# Patient Record
Sex: Male | Born: 1965 | Race: Black or African American | Hispanic: No | Smoking: Current some day smoker
Health system: Southern US, Community
[De-identification: ages and names within clinical notes are randomized; demographics above are authoritative.]

## PROBLEM LIST (undated history)

## (undated) HISTORY — PX: TESTICLE SURGERY: SHX794

---

## 2000-12-18 ENCOUNTER — Encounter: Admission: RE | Admit: 2000-12-18 | Discharge: 2001-01-23 | Payer: Self-pay | Admitting: *Deleted

## 2003-03-04 ENCOUNTER — Emergency Department (HOSPITAL_COMMUNITY): Admission: EM | Admit: 2003-03-04 | Discharge: 2003-03-04 | Payer: Self-pay | Admitting: Emergency Medicine

## 2011-09-15 ENCOUNTER — Emergency Department (HOSPITAL_COMMUNITY)
Admission: EM | Admit: 2011-09-15 | Discharge: 2011-09-15 | Disposition: A | Discharge status: Home / Self Care | Payer: Self-pay | Attending: Emergency Medicine | Admitting: Emergency Medicine

## 2011-09-15 DIAGNOSIS — F1410 Cocaine abuse, uncomplicated: Secondary | ICD-10-CM | POA: Insufficient documentation

## 2011-09-15 DIAGNOSIS — F17200 Nicotine dependence, unspecified, uncomplicated: Secondary | ICD-10-CM | POA: Insufficient documentation

## 2011-09-15 DIAGNOSIS — F1910 Other psychoactive substance abuse, uncomplicated: Secondary | ICD-10-CM

## 2011-09-15 LAB — BASIC METABOLIC PANEL
CO2: 28 mEq/L (ref 19–32)
Glucose, Bld: 122 mg/dL — ABNORMAL HIGH (ref 70–99)
Potassium: 3.5 mEq/L (ref 3.5–5.1)
Sodium: 139 mEq/L (ref 135–145)

## 2011-09-15 LAB — CBC
Platelets: 268 10*3/uL (ref 150–400)
RBC: 4.34 MIL/uL (ref 4.22–5.81)
WBC: 6.7 10*3/uL (ref 4.0–10.5)

## 2011-09-15 LAB — DIFFERENTIAL
Lymphocytes Relative: 33 % (ref 12–46)
Lymphs Abs: 2.2 10*3/uL (ref 0.7–4.0)
Neutro Abs: 3.9 10*3/uL (ref 1.7–7.7)
Neutrophils Relative %: 58 % (ref 43–77)

## 2011-09-15 LAB — ETHANOL: Alcohol, Ethyl (B): <11 mg/dL (ref 0–11)

## 2011-09-15 MED ORDER — ALPRAZOLAM 0.5 MG PO TABS: ORAL_TABLET | ORAL | Status: DC

## 2011-09-15 NOTE — BH Assessment (Signed)
Assessment Note   Allen Cruz is an 46 y.o. male. The patient comes to the ED, seeking a return to rehab for his cocaine addiction. He was at [facility] for 14 days in December 2012. After discharge he did not follow up with [facility] as scheduled. He says he did not have transportation. After about a week he relapsed , and is again binging on crack cocaine.  He says he will use 1 or 2 days then stop for 3 or 4 days. He blames  The environment he is living in for his relapse. He is with family and they all do drugs or alcohol according to him. He denies any SI or HI. He is not psychotic.  He is unsure how long he has been using cocaine. He uses it by binging for a day or two, the he will not use for several days. His last use was 1213.  Axis I:  Cocaine Dependence Axis II: Deferred Axis III: History reviewed. No pertinent past medical history. Axis IV: economic problems, housing problems, occupational problems, problems related to social environment and problems with access to health care services Axis V: 41-50 serious symptoms  Past Medical History: History reviewed. No pertinent past medical history.  Past Surgical History  Procedure Date  . Testicle surgery     Family History: No family history on file.  Social History:  reports that he has been smoking.  He does not have any smokeless tobacco history on file. He reports that he uses illicit drugs (Cocaine) about 3 times per week. He reports that he does not drink alcohol.  Additional Social History:    Allergies:  Allergies  Allergen Reactions  . Ranitidine Hives    Home Medications:  No current facility-administered medications on file as of 09/15/2011.   No current outpatient prescriptions on file as of 09/15/2011.    OB/GYN Status:  No LMP for male patient.  General Assessment Data Location of Assessment: [facility] Assessment: Yes Living Arrangements: Spouse/significant other;Family members;Non-Relatives Can pt  return to current living arrangement?: Yes Admission Status: Voluntary Is patient capable of signing voluntary admission?: Yes Transfer from: Acute Hospital Referral Source: MD  Education Status Is patient currently in school?: No  Risk to self Suicidal Ideation: No Suicidal Intent: No Is patient at risk for suicide?: No Suicidal Plan?: No Access to Means: No What has been your use of drugs/alcohol within the last 12 months?: crack cocaine Previous Attempts/Gestures: No How many times?: 0  Other Self Harm Risks: none Triggers for Past Attempts: None known Intentional Self Injurious Behavior: None Family Suicide History: No Recent stressful life event(s): Financial Problems;Recent negative physical changes Persecutory voices/beliefs?: No Depression: Yes Depression Symptoms: Isolating;Loss of interest in usual pleasures Substance abuse history and/or treatment for substance abuse?: Yes Suicide prevention information given to non-admitted patients: Yes  Risk to Others Homicidal Ideation: No Thoughts of Harm to Others: No Current Homicidal Intent: No Current Homicidal Plan: No Access to Homicidal Means: No Identified Victim:  none History of harm to others?: No Assessment of Violence: None Noted Violent Behavior Description: calm Does patient have access to weapons?: No Criminal Charges Pending?: No Does patient have a court date: No  Psychosis Hallucinations: None noted Delusions: None noted  Mental Status Report Appear/Hygiene: Body odor Eye Contact: Fair Motor Activity: Restlessness Speech: Logical/coherent Level of Consciousness: Alert Mood: Anxious Affect: Blunted Anxiety Level: Moderate Thought Processes: Coherent;Relevant Judgement: Unimpaired Orientation: Person;Place;Time;Situation Obsessive Compulsive Thoughts/Behaviors: None  Cognitive Functioning  Concentration: Normal Memory: Recent Intact;Remote Intact IQ: Average Insight: Poor Impulse  Control: Poor Appetite: Good Weight Loss: 0  Weight Gain: 0  Sleep: No Change Total Hours of Sleep: 6  Vegetative Symptoms: None  Prior Inpatient Therapy Prior Inpatient Therapy: Yes Prior Therapy Dates: 12/12 Prior Therapy Facilty/Provider(s): [facility] Reason for Treatment: rehab  Prior Outpatient Therapy Prior Outpatient Therapy: Yes Prior Therapy Dates: 2012 Prior Therapy Facilty/Provider(s): [facility] Reason for Treatment: cocaine abuse            Values / Beliefs Cultural Requests During Hospitalization: None Spiritual Requests During Hospitalization: None        Additional Information 1:1 In Past 12 Months?: No CIRT Risk: No Elopement Risk: No Does patient have medical clearance?: Yes     Disposition: Spoke with Allen Cruz at [facility], she informed us that [facility] nor [facility] would authorize his stay in rehab. They will see him 1313 at 8:00 am to discuss long term treatment  Options with him.  They also said that [facility] would not readmit him for up to a year as is their policy. Patient was informed that rehab could not be arranged through the ED and that he would have to go to [facility] Friday or Monday to be seen for follow up and referral. Dr. Estell Harpin is in agreement with this disposition. Disposition Disposition of Patient: Outpatient treatment;Referred to Type of outpatient treatment: Adult  On Site Evaluation by:   Reviewed with Physician:     Jearld Pies 09/15/2011 3:00 PM

## 2011-09-15 NOTE — ED Provider Notes (Signed)
History   This chart was scribed for Benny Lennert, MD by Clarita Crane. The patient was seen in room [facility] and the patient's care was started at 12:35PM.   CSN: [id number]  Arrival date & time 09/15/11  1010   First MD Initiated Contact with Patient 09/15/11 1232      Chief Complaint  Patient presents with  . Medical Clearance    (Consider location/radiation/quality/duration/timing/severity/associated sxs/prior treatment) HPI Allen Cruz is a 46 y.o. male who presents to the Emergency Department requesting rehab for cocaine abuse. Patient notes he has been abusing cocaine on a daily basis with his last use 9 hours ago. Patient states he was previously treated at [facility] for cocaine abuse foor 14 days and was most recently discharged from [facility] on Aug 16, 2011. Denies SI/HI, hallucinations.   History reviewed. No pertinent past medical history.  Past Surgical History  Procedure Date  . Testicle surgery     No family history on file.  History  Substance Use Topics  . Smoking status: Current Some Day Smoker  . Smokeless tobacco: Not on file  . Alcohol Use: No      Review of Systems 10 Systems reviewed and are negative for acute change except as noted in the HPI.  Allergies  Ranitidine  Home Medications   Current Outpatient Rx  Name Route Sig Dispense Refill  . ASPIRIN 325 MG PO TABS Oral Take 325 mg by mouth every 4 (four) hours as needed. pain     . IBUPROFEN 200 MG PO TABS Oral Take 200 mg by mouth every 6 (six) hours as needed. Pain      . ALPRAZOLAM 0.5 MG PO TABS  1 every 8 hours for anxiety or sleep 20 tablet 0    BP 121/81  Pulse 80  Temp(Src) 98 F (36.7 C) (Oral)  Resp 18  Ht 5\' 6"  (1.676 m)  Wt 215 lb (97.523 kg)  BMI 34.70 kg/m2  SpO2 100%  Physical Exam  Nursing note and vitals reviewed. Constitutional: He is oriented to person, place, and time. He appears well-developed and well-nourished. No distress.  HENT:  Head: Normocephalic  and atraumatic.  Eyes: EOM are normal. Pupils are equal, round, and reactive to light.  Neck: Neck supple. No tracheal deviation present.  Cardiovascular: Normal rate.   Pulmonary/Chest: Effort normal. No respiratory distress.  Abdominal: Soft. He exhibits no distension.  Musculoskeletal: Normal range of motion. He exhibits no edema.  Neurological: He is alert and oriented to person, place, and time. No sensory deficit.  Skin: Skin is warm and dry.  Psychiatric: He has a normal mood and affect. His speech is normal and behavior is normal. Thought content normal. He expresses no homicidal and no suicidal ideation. He expresses no suicidal plans and no homicidal plans.    ED Course  Procedures (including critical care time)  DIAGNOSTIC STUDIES: Oxygen Saturation is 100% on room air, normal by my interpretation.    COORDINATION OF CARE: 12:48PM- ACT team counselor informed of patient history and will speak with patient.  3:18PM- Patient informed of intent to d/c home. Requests medication to help him rest.   Labs Reviewed  BASIC METABOLIC PANEL - Abnormal; Notable for the following:    Glucose, Bld 122 (*)    Creatinine, Ser 1.48 (*)    GFR calc non Af Amer 56 (*)    GFR calc Af Amer 64 (*)    All other components within normal limits  CBC  DIFFERENTIAL  ETHANOL  URINALYSIS, ROUTINE W REFLEX MICROSCOPIC  URINE RAPID DRUG SCREEN (HOSP PERFORMED)   No results found.   1. Substance abuse    Pt seen by act and he was [facility]   MDM       The chart was scribed for me under my direct supervision.  I personally performed the history, physical, and medical decision making and all procedures in the evaluation of this patient.Benny Lennert, MD 09/15/11 [id number]

## 2011-09-15 NOTE — ED Notes (Signed)
Pt eval by ACT. Waiting for disposition

## 2011-09-15 NOTE — ED Notes (Signed)
Pt here and reports using cocaine on daily basis "as much as I can do in a day" last take was at 0400 this am. Denies si/hi. Pt here to receive detox. Pt recently was seen and treated but kept only 14days and feels like he needs longer treatment.

## 2011-09-19 ENCOUNTER — Encounter (HOSPITAL_COMMUNITY): Payer: Self-pay | Admitting: Emergency Medicine

## 2011-09-19 ENCOUNTER — Emergency Department (HOSPITAL_COMMUNITY): Payer: Self-pay

## 2011-09-19 ENCOUNTER — Emergency Department (HOSPITAL_COMMUNITY)
Admission: EM | Admit: 2011-09-19 | Discharge: 2011-09-20 | Disposition: A | Discharge status: Home / Self Care | Payer: Self-pay | Attending: Emergency Medicine | Admitting: Emergency Medicine

## 2011-09-19 DIAGNOSIS — F411 Generalized anxiety disorder: Secondary | ICD-10-CM | POA: Insufficient documentation

## 2011-09-19 DIAGNOSIS — F17200 Nicotine dependence, unspecified, uncomplicated: Secondary | ICD-10-CM | POA: Insufficient documentation

## 2011-09-19 DIAGNOSIS — Z79899 Other long term (current) drug therapy: Secondary | ICD-10-CM | POA: Insufficient documentation

## 2011-09-19 DIAGNOSIS — M79609 Pain in unspecified limb: Secondary | ICD-10-CM | POA: Insufficient documentation

## 2011-09-19 DIAGNOSIS — Z7982 Long term (current) use of aspirin: Secondary | ICD-10-CM | POA: Insufficient documentation

## 2011-09-19 DIAGNOSIS — IMO0002 Reserved for concepts with insufficient information to code with codable children: Secondary | ICD-10-CM | POA: Insufficient documentation

## 2011-09-19 DIAGNOSIS — R450 Nervousness: Secondary | ICD-10-CM | POA: Insufficient documentation

## 2011-09-19 DIAGNOSIS — F1910 Other psychoactive substance abuse, uncomplicated: Secondary | ICD-10-CM | POA: Insufficient documentation

## 2011-09-19 DIAGNOSIS — X58XXXA Exposure to other specified factors, initial encounter: Secondary | ICD-10-CM | POA: Insufficient documentation

## 2011-09-19 LAB — CBC
HCT: 39.1 % (ref 39.0–52.0)
Hemoglobin: 12.6 g/dL — ABNORMAL LOW (ref 13.0–17.0)
MCH: 30.2 pg (ref 26.0–34.0)
MCHC: 32.2 g/dL (ref 30.0–36.0)
MCV: 93.8 fL (ref 78.0–100.0)
Platelets: 249 10*3/uL (ref 150–400)
RBC: 4.17 MIL/uL — ABNORMAL LOW (ref 4.22–5.81)
RDW: 13.5 % (ref 11.5–15.5)
WBC: 6.6 10*3/uL (ref 4.0–10.5)

## 2011-09-19 LAB — RAPID URINE DRUG SCREEN, HOSP PERFORMED
Amphetamines: NOT DETECTED
Barbiturates: NOT DETECTED
Benzodiazepines: NOT DETECTED
Cocaine: POSITIVE — AB
Opiates: NOT DETECTED
Tetrahydrocannabinol: NOT DETECTED

## 2011-09-19 LAB — COMPREHENSIVE METABOLIC PANEL
Albumin: 3.5 g/dL (ref 3.5–5.2)
BUN: 18 mg/dL (ref 6–23)
CO2: 27 mEq/L (ref 19–32)
Chloride: 107 mEq/L (ref 96–112)
Creatinine, Ser: 1.22 mg/dL (ref 0.50–1.35)
GFR calc Af Amer: 81 mL/min — ABNORMAL LOW (ref >90)
Glucose, Bld: 101 mg/dL — ABNORMAL HIGH (ref 70–99)
Potassium: 4 mEq/L (ref 3.5–5.1)
Total Bilirubin: 0.2 mg/dL — ABNORMAL LOW (ref 0.3–1.2)
Total Protein: 7.3 g/dL (ref 6.0–8.3)

## 2011-09-19 LAB — COMPREHENSIVE METABOLIC PANEL WITH GFR
ALT: 14 U/L (ref 0–53)
AST: 17 U/L (ref 0–37)
Alkaline Phosphatase: 88 U/L (ref 39–117)
Calcium: 9 mg/dL (ref 8.4–10.5)
GFR calc non Af Amer: 70 mL/min — ABNORMAL LOW (ref >90)
Sodium: 141 meq/L (ref 135–145)

## 2011-09-19 LAB — ETHANOL: Alcohol, Ethyl (B): <11 mg/dL (ref 0–11)

## 2011-09-19 MED ORDER — ADULT MULTIVITAMIN W/MINERALS CH
1.0000 | ORAL_TABLET | Freq: Every day | ORAL | Status: DC
  Administered 2011-09-19 – 2011-09-20 (×2): 1 via ORAL
  Filled 2011-09-19 (×2): qty 1

## 2011-09-19 MED ORDER — IBUPROFEN 200 MG PO TABS
600.0000 mg | ORAL_TABLET | Freq: Three times a day (TID) | ORAL | Status: DC | PRN
  Administered 2011-09-20 (×2): 600 mg via ORAL
  Filled 2011-09-19 (×2): qty 3

## 2011-09-19 MED ORDER — ZOLPIDEM TARTRATE 5 MG PO TABS
5.0000 mg | ORAL_TABLET | Freq: Every evening | ORAL | Status: DC | PRN
  Administered 2011-09-20: 5 mg via ORAL
  Filled 2011-09-19: qty 1

## 2011-09-19 MED ORDER — LORAZEPAM 1 MG PO TABS: 1.0000 mg | ORAL_TABLET | Freq: Three times a day (TID) | ORAL | Status: DC | PRN

## 2011-09-19 MED ORDER — FOLIC ACID 1 MG PO TABS
1.0000 mg | ORAL_TABLET | Freq: Every day | ORAL | Status: DC
  Administered 2011-09-19 – 2011-09-20 (×2): 1 mg via ORAL
  Filled 2011-09-19 (×3): qty 1

## 2011-09-19 MED ORDER — LORAZEPAM 2 MG/ML IJ SOLN: 1.0000 mg | Freq: Four times a day (QID) | INTRAMUSCULAR | Status: DC | PRN

## 2011-09-19 MED ORDER — ACETAMINOPHEN 325 MG PO TABS
650.0000 mg | ORAL_TABLET | ORAL | Status: DC | PRN
  Administered 2011-09-19: 650 mg via ORAL
  Filled 2011-09-19: qty 2

## 2011-09-19 MED ORDER — ALUM & MAG HYDROXIDE-SIMETH 200-200-20 MG/5ML PO SUSP: 30.0000 mL | ORAL | Status: DC | PRN

## 2011-09-19 MED ORDER — ONDANSETRON HCL 8 MG PO TABS: 4.0000 mg | ORAL_TABLET | Freq: Three times a day (TID) | ORAL | Status: DC | PRN

## 2011-09-19 MED ORDER — LORAZEPAM 1 MG PO TABS
1.0000 mg | ORAL_TABLET | Freq: Four times a day (QID) | ORAL | Status: DC | PRN
  Filled 2011-09-19: qty 1

## 2011-09-19 MED ORDER — THIAMINE HCL 100 MG/ML IJ SOLN: 100.0000 mg | Freq: Every day | INTRAMUSCULAR | Status: DC

## 2011-09-19 MED ORDER — NICOTINE 21 MG/24HR TD PT24
21.0000 mg | MEDICATED_PATCH | Freq: Every day | TRANSDERMAL | Status: DC
  Administered 2011-09-19: 21 mg via TRANSDERMAL
  Filled 2011-09-19: qty 1

## 2011-09-19 MED ORDER — VITAMIN B-1 100 MG PO TABS
100.0000 mg | ORAL_TABLET | Freq: Every day | ORAL | Status: DC
  Administered 2011-09-19 – 2011-09-20 (×3): 100 mg via ORAL
  Filled 2011-09-19 (×2): qty 1

## 2011-09-19 NOTE — ED Notes (Signed)
Pt denies si/hi. Pt belongings returned.

## 2011-09-19 NOTE — BH Assessment (Addendum)
Assessment Note   Allen Cruz is an 46 y.o. male.  Allen Cruz came to [facility] seeking detox service from ETOH.  He reports drinking three 24oz beers daily for the last two weeks.  Last consumption was yesterday evening and was two beers.  Allen Cruz is also using crack but that use varies although the frequency has been increasing over the last month.  Allen Cruz said that he has no SI, HI or A/V hallucinations at this time.  He is depressed about his family life and drug abuse.  Allen Cruz is currently living with a daughter in [place] and is currently unemployed.  He is interested in going to a halfway house if possible after treatment.  Allen Cruz is interested in returning to [facility].  He left there on December 4 and remained dry until December 24 when he resumed drinking a 6 pack daily.  Allen Cruz was also referred to [facility].  Allen Cruz at [facility] said that Allen Cruz had been accepted.  [facility] had been contacted also.  This was explained to Allen Cruz and he said that he could get his wife to drive him when she gets off work at [employer].  He understood that he is to go straight there without any stops.  He was also given printed directions to the facility. Axis I: Alcohol Abuse Axis II: Deferred Axis III: History reviewed. No pertinent past medical history. Axis IV: occupational problems, other psychosocial or environmental problems and problems with primary support group Axis V: 31-40 impairment in reality testing  Past Medical History: History reviewed. No pertinent past medical history.  Past Surgical History  Procedure Date  . Testicle surgery     Family History: History reviewed. No pertinent family history.  Social History:  reports that he has been smoking.  He does not have any smokeless tobacco history on file. He reports that he drinks alcohol. He reports that he uses illicit drugs (Cocaine) about 3 times per week.  Additional Social History:  Alcohol / Drug Use Pain Medications:  Denies Prescriptions: None Over the Counter: N/A History of alcohol / drug use?: Yes Substance #1 Name of Substance 1: ETOH, beer 1 - Age of First Use: 46 years old 1 - Amount (size/oz): Drinking three 24 oz beers (equal to a 6-pack) daily 1 - Frequency: Daily  1 - Duration: Last 2 weeks 1 - Last Use / Amount: 01/06, drank 2 beers Substance #2 Name of Substance 2: Crack cocaine 2 - Age of First Use: 46 years old 2 - Amount (size/oz): Varies 2 - Frequency: 4-6 times weekly 2 - Duration: Last two weeks 2 - Last Use / Amount: 01/06  Used an "8-Ball" Allergies:  Allergies  Allergen Reactions  . Ranitidine Hives    Home Medications:  Medications Prior to Admission  Medication Dose Route Frequency Provider Last Rate Last Dose  . acetaminophen (TYLENOL) tablet 650 mg  650 mg Oral Q4H PRN [name], PA   650 mg at 09/19/11 1902  . alum & mag hydroxide-simeth (MAALOX/MYLANTA) 200-200-20 MG/5ML suspension 30 mL  30 mL Oral PRN [name], PA      . folic acid (FOLVITE) tablet 1 mg  1 mg Oral Daily [name], [place]   1 mg at 09/19/11 2012  . ibuprofen (ADVIL,MOTRIN) tablet 600 mg  600 mg Oral Q8H PRN [name], [place]      . LORazepam (ATIVAN) tablet 1 mg  1 mg Oral Q6H PRN [name], PA      .  mulitivitamin with minerals tablet 1 tablet  1 tablet Oral Daily [name], [place]   1 tablet at 09/19/11 2007  . nicotine (NICODERM CQ - dosed in mg/24 hours) patch 21 mg  21 mg Transdermal Daily [name], [place]   21 mg at 09/19/11 1853  . ondansetron (ZOFRAN) tablet 4 mg  4 mg Oral Q8H PRN [name], [place]      . thiamine (VITAMIN B-1) tablet 100 mg  100 mg Oral Daily [name], [place]   100 mg at 09/19/11 2010  . zolpidem (AMBIEN) tablet 5 mg  5 mg Oral QHS PRN [name], PA      . DISCONTD: LORazepam (ATIVAN) injection 1 mg  1 mg Intravenous Q6H PRN [name], PA       . DISCONTD: LORazepam (ATIVAN) tablet 1 mg  1 mg Oral Q8H PRN [name], PA      . DISCONTD: thiamine (B-1) injection 100 mg  100 mg Intravenous Daily [name], [place]       Medications Prior to Admission  Medication Sig Dispense Refill  . ALPRAZolam (XANAX) 0.5 MG tablet Take 0.5 mg by mouth 3 (three) times daily as needed. for anxiety or sleep       . aspirin 325 MG tablet Take 325 mg by mouth daily.       Marland Kitchen ibuprofen (ADVIL,MOTRIN) 200 MG tablet Take 200 mg by mouth every 6 (six) hours as needed. Pain         OB/GYN Status:  No LMP for male patient.  General Assessment Data Location of Assessment: [facility] ED Living Arrangements: Family members Can pt return to current living arrangement?: Yes Admission Status: Voluntary Is patient capable of signing voluntary admission?: Yes Transfer from: Acute Hospital Referral Source: Self/Family/Friend     Risk to self Suicidal Ideation: No Suicidal Intent: No Is patient at risk for suicide?: No Suicidal Plan?: No Access to Means: No What has been your use of drugs/alcohol within the last 12 months?:  (Crack cocaine use and ETOH use) Previous Attempts/Gestures: Yes How many times?:  (2-3 times) Other Self Harm Risks: None Triggers for Past Attempts: Family contact Intentional Self Injurious Behavior: None Family Suicide History: No Recent stressful life event(s): Financial Problems Persecutory voices/beliefs?: No Depression: Yes Depression Symptoms: Isolating;Loss of interest in usual pleasures Substance abuse history and/or treatment for substance abuse?: Yes Suicide prevention information given to non-admitted patients: Not applicable  Risk to Others Homicidal Ideation: No Thoughts of Harm to Others: No Current Homicidal Intent: No Current Homicidal Plan: No Access to Homicidal Means: No Identified Victim: No one History of harm to others?: No Assessment of Violence: None Noted Violent Behavior  Description: Calm and cooperative Does patient have access to weapons?: No Criminal Charges Pending?: No Does patient have a court date: No  Psychosis Hallucinations: None noted Delusions: None noted  Mental Status Report Appear/Hygiene: Body odor Eye Contact: Good Motor Activity: Unremarkable Speech: Logical/coherent Level of Consciousness: Alert Mood: Anxious Affect: Depressed Anxiety Level: Minimal Thought Processes: Coherent;Relevant Judgement: Unimpaired Orientation: Person;Place;Time;Situation Obsessive Compulsive Thoughts/Behaviors: None  Cognitive Functioning Concentration: Decreased Memory: Recent Impaired;Remote Intact IQ: Average Insight: Fair Impulse Control: Poor Appetite: Poor Weight Loss: 5  Weight Gain:  (N/A) Sleep: Decreased Total Hours of Sleep:  (<6H/D) Vegetative Symptoms: None  Prior Inpatient Therapy Prior Inpatient Therapy: Yes Prior Therapy Dates: 12/12 Prior Therapy Facilty/Provider(s): [facility] Reason for Treatment: rehab  Prior Outpatient Therapy Prior Outpatient Therapy: Yes  Prior Therapy Dates: 2012 Prior Therapy Facilty/Provider(s): [facility] Reason for Treatment: cocaine abuse          Abuse/Neglect Assessment (Assessment to be complete while patient is alone) Physical Abuse: Denies Verbal Abuse: Denies Sexual Abuse: Denies Exploitation of patient/patient's resources: Denies Self-Neglect: Denies Values / Beliefs Cultural Requests During Hospitalization: None Spiritual Requests During Hospitalization: None        Additional Information 1:1 In Past 12 Months?: No CIRT Risk: No Elopement Risk: No Does patient have medical clearance?: Yes     Disposition:  Disposition Disposition of Patient: Inpatient treatment program;Referred to Type of inpatient treatment program: Adult Type of outpatient treatment:  (Referred to [facility] )  On Site Evaluation by:   Reviewed with Physician:     Alexandria Lodge 09/19/2011 10:46 PM

## 2011-09-19 NOTE — ED Provider Notes (Signed)
History     CSN: [id number]  Arrival date & time 09/19/11  1223   First MD Initiated Contact with Patient 09/19/11 [phone]      Chief Complaint  Patient presents with  . Medical Clearance    (Consider location/radiation/quality/duration/timing/severity/associated sxs/prior treatment) Patient is a 46 y.o. male presenting with drug problem. The history is provided by the patient.  Drug Problem This is a chronic problem. The current episode started more than 1 year ago. The problem occurs every several days. The problem has been unchanged. Pertinent negatives include no abdominal pain, chest pain, chills, coughing, fever, headaches, nausea, neck pain, rash, sore throat, vomiting or weakness. The symptoms are aggravated by nothing. He has tried nothing for the symptoms.   patient presents to the emergency department requesting help with detox from crack cocaine and alcohol. He reports that he has been using crack cocaine whenever he has the money" up to every day for an unknown period of time. He was discharged from [facility] on 08/16/2011 after a two-week treatment period for this complaint. He also reports that he has recently begun drinking alcohol, approx 3-24oz beers each day.Denies alcohol detox or seizures in the past. Per prior record, pt was seen at [facility] on 09/14/2010 with a request for cocaine detox but he did not divulge any EtOH use at that time. He denied any SI, HI, or hallucinations.   History reviewed. No pertinent past medical history.  Past Surgical History  Procedure Date  . Testicle surgery     History reviewed. No pertinent family history.  History  Substance Use Topics  . Smoking status: Current Some Day Smoker  . Smokeless tobacco: Not on file  . Alcohol Use: Yes      Review of Systems  Constitutional: Negative for fever and chills.  HENT: Negative for ear pain, sore throat, neck pain and neck stiffness.   Eyes: Negative for pain and visual disturbance.    Respiratory: Negative for cough and shortness of breath.   Cardiovascular: Negative for chest pain and leg swelling.  Gastrointestinal: Negative for nausea, vomiting and abdominal pain.  Genitourinary: Negative for dysuria and hematuria.  Musculoskeletal: Negative for back pain and gait problem.       Left great toe pain, unknown injury, onset today  Skin: Negative for rash.       Wound to right great toe  Neurological: Negative for seizures, syncope, speech difficulty, weakness and headaches.  Psychiatric/Behavioral: Negative for suicidal ideas, hallucinations, confusion and self-injury. The patient is nervous/anxious.     Allergies  Ranitidine  Home Medications   Current Outpatient Rx  Name Route Sig Dispense Refill  . ALPRAZOLAM 0.5 MG PO TABS Oral Take 0.5 mg by mouth 3 (three) times daily as needed. for anxiety or sleep     . ASPIRIN 325 MG PO TABS Oral Take 325 mg by mouth daily.     . IBUPROFEN 200 MG PO TABS Oral Take 200 mg by mouth every 6 (six) hours as needed. Pain       BP 132/73  Pulse 84  Temp 97.6 F (36.4 C)  SpO2 96%  Physical Exam  Nursing note and vitals reviewed. Constitutional: He is oriented to person, place, and time. He appears well-developed and well-nourished. No distress.  HENT:  Head: Normocephalic and atraumatic.  Right Ear: External ear normal.  Left Ear: External ear normal.  Mouth/Throat: Oropharynx is clear and moist.  Eyes: Pupils are equal, round, and reactive to light.  Neck:  Normal range of motion. Neck supple.  Cardiovascular: Normal rate, regular rhythm and normal heart sounds.   No murmur heard. Pulmonary/Chest: Effort normal and breath sounds normal. No respiratory distress. He has no wheezes. He exhibits no tenderness.  Abdominal: Soft. Bowel sounds are normal. He exhibits no distension. There is no tenderness.  Musculoskeletal: Normal range of motion. He exhibits no edema.       Mild TTP at base of right great toe, over end  of 1st metatarsal without swelling or any overlying skin changes. Capillary refill <2 seconds without pain to nailbed compression. No pain to palpation of any other part of the foot or any other joints.  Neurological: He is alert and oriented to person, place, and time. No cranial nerve deficit.       Antalgic gait without ataxia  Skin: Skin is warm and dry.       3mm abrasion to left great toe, at the cuticle line  Psychiatric: His mood appears anxious. He expresses no homicidal and no suicidal ideation. He expresses no suicidal plans and no homicidal plans.    ED Course  Procedures (including critical care time)  Labs Reviewed  URINE RAPID DRUG SCREEN (HOSP PERFORMED) - Abnormal; Notable for the following:    Cocaine POSITIVE (*)    All other components within normal limits  COMPREHENSIVE METABOLIC PANEL - Abnormal; Notable for the following:    Glucose, Bld 101 (*)    Total Bilirubin 0.2 (*)    GFR calc non Af Amer 70 (*)    GFR calc Af Amer 81 (*)    All other components within normal limits  CBC - Abnormal; Notable for the following:    RBC 4.17 (*)    Hemoglobin 12.6 (*)    All other components within normal limits  ETHANOL   No results found.   Dx 1: Cocaine abuse Dx 2: Alcohol abuse   MDM  Requests Detox. No SI/HI. Voluntary. ACT team aware.  12:07 AM Care overnight will be assumed by Dr Manus Gunning, who has been given report on this patient.        [address] [place], [place] 09/20/11 [phone]

## 2011-09-19 NOTE — ED Notes (Signed)
Pt here for detox from crack cocaine and ETOH; pt sts last used cocaine and ETOH last night

## 2011-09-20 MED ORDER — PENICILLIN V POTASSIUM 500 MG PO TABS: 500.0000 mg | ORAL_TABLET | Freq: Four times a day (QID) | ORAL | Status: AC

## 2011-09-20 NOTE — ED Provider Notes (Signed)
Medical screening examination/treatment/procedure(s) were performed by non-physician practitioner and as supervising physician I was immediately available for consultation/collaboration. Coline Calkin Y.   Birdie Fetty Y. Hollie Wojahn, MD 09/20/11 0356 

## 2011-09-20 NOTE — ED Notes (Signed)
Pt c/o toothache to left lower mouth. Pt medicated with 600mg  motrin.

## 2011-09-20 NOTE — ED Notes (Signed)
Patient accepted at [facility].  Voluntary.  Spouse to drive there in AM.  Glynn Octave, MD 09/20/11 [phone]

## 2011-09-20 NOTE — ED Notes (Signed)
Ativan 1 mg fiven once earlier 1 mg.

## 2011-09-24 ENCOUNTER — Emergency Department: Payer: Self-pay | Admitting: Emergency Medicine

## 2012-12-31 ENCOUNTER — Emergency Department (HOSPITAL_COMMUNITY)
Admission: EM | Admit: 2012-12-31 | Discharge: 2012-12-31 | Disposition: A | Discharge status: Home / Self Care | Payer: No Typology Code available for payment source | Attending: Emergency Medicine | Admitting: Emergency Medicine

## 2012-12-31 ENCOUNTER — Encounter (HOSPITAL_COMMUNITY): Payer: Self-pay | Admitting: *Deleted

## 2012-12-31 DIAGNOSIS — Y9389 Activity, other specified: Secondary | ICD-10-CM | POA: Insufficient documentation

## 2012-12-31 DIAGNOSIS — T148XXA Other injury of unspecified body region, initial encounter: Secondary | ICD-10-CM

## 2012-12-31 DIAGNOSIS — Z7982 Long term (current) use of aspirin: Secondary | ICD-10-CM | POA: Insufficient documentation

## 2012-12-31 DIAGNOSIS — S335XXA Sprain of ligaments of lumbar spine, initial encounter: Secondary | ICD-10-CM | POA: Insufficient documentation

## 2012-12-31 DIAGNOSIS — Z79899 Other long term (current) drug therapy: Secondary | ICD-10-CM | POA: Insufficient documentation

## 2012-12-31 DIAGNOSIS — F17200 Nicotine dependence, unspecified, uncomplicated: Secondary | ICD-10-CM | POA: Insufficient documentation

## 2012-12-31 DIAGNOSIS — Y92410 Unspecified street and highway as the place of occurrence of the external cause: Secondary | ICD-10-CM | POA: Insufficient documentation

## 2012-12-31 MED ORDER — CYCLOBENZAPRINE HCL 10 MG PO TABS: 10.0000 mg | ORAL_TABLET | Freq: Two times a day (BID) | ORAL | Status: AC | PRN
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
## 2013-01-01 NOTE — ED Provider Notes (Signed)
Medical screening examination/treatment/procedure(s) were performed by non-physician practitioner and as supervising physician I was immediately available for consultation/collaboration.    Croy Drumwright L Fady Stamps, MD 01/01/13 0710 

## 2013-03-04 IMAGING — CR DG FOOT COMPLETE 3+V*L*
3 series · 3 of 3 positions shown · non-contrast
Comparison: None.

CLINICAL DATA: Left foot pain with abrasion at the great toe nail
bed.

LEFT FOOT - COMPLETE 3+ VIEW

[t foot ap left]
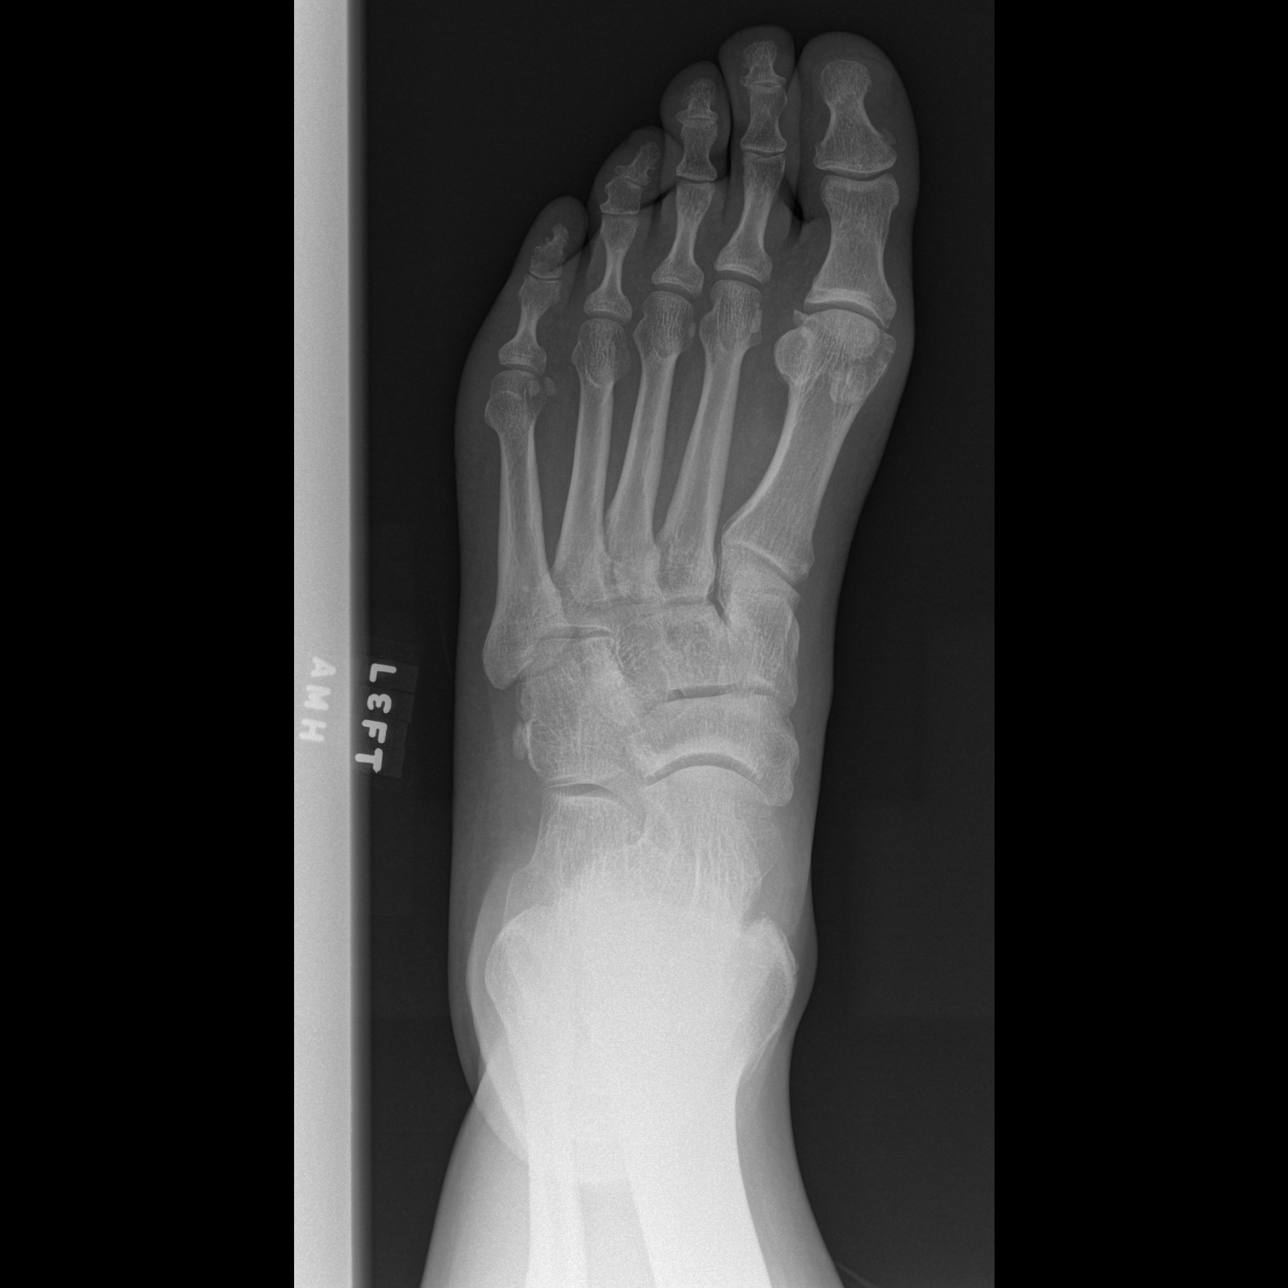

[t foot oblique left]
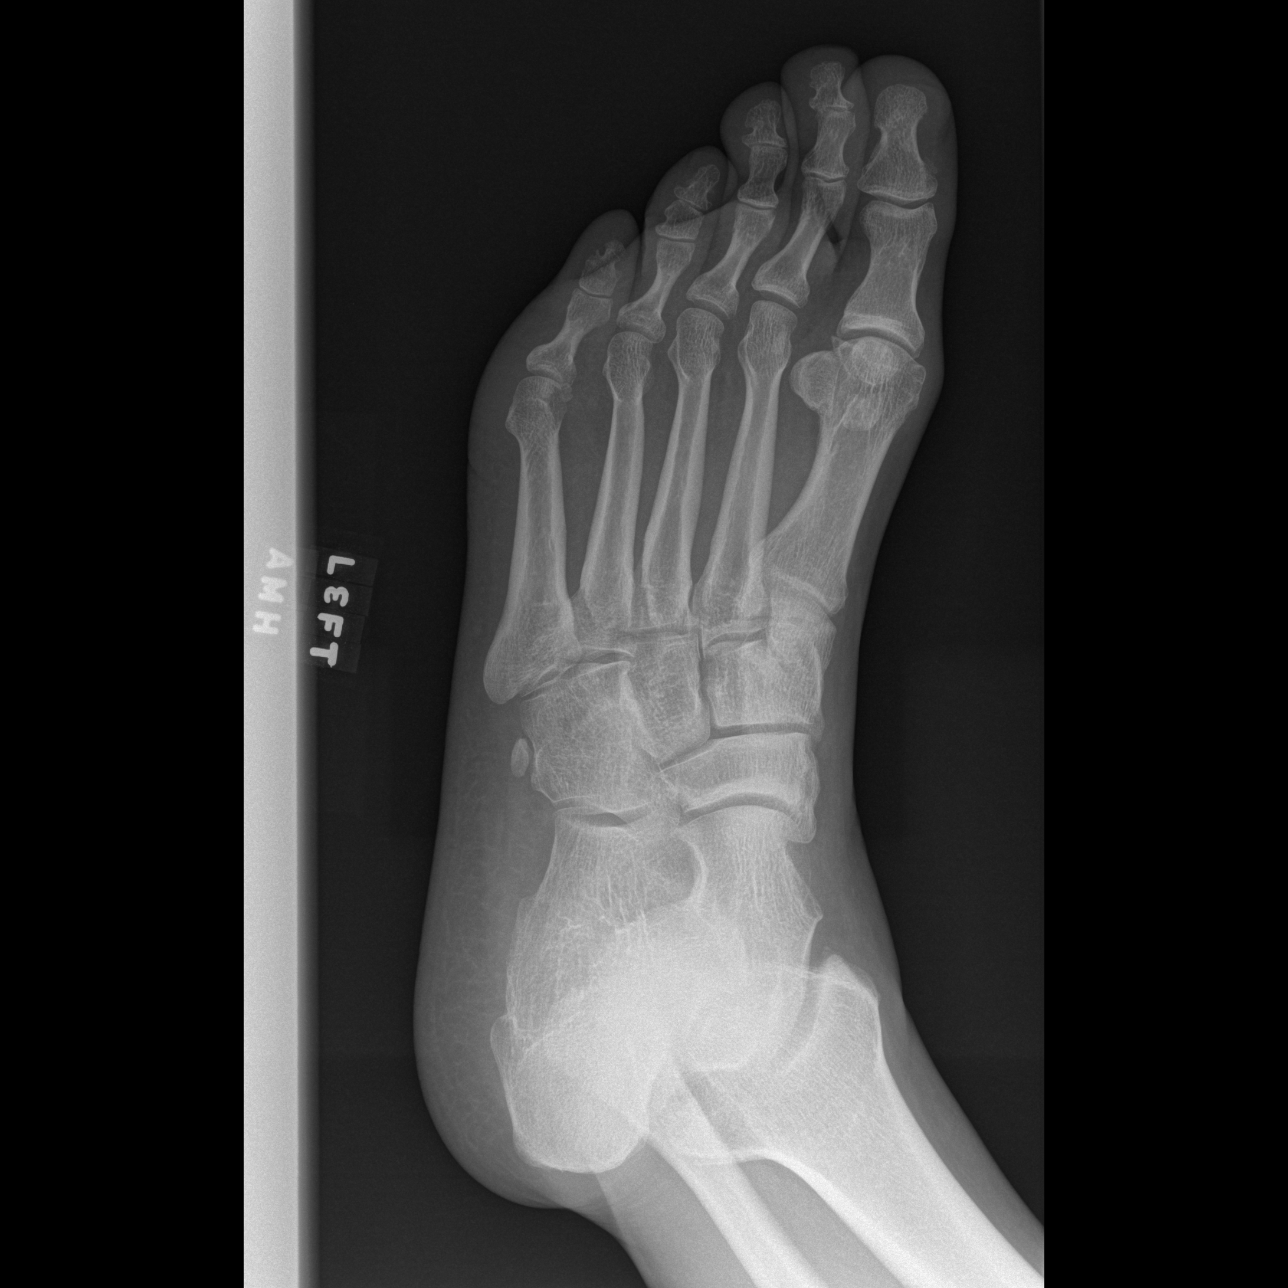

[t foot lat left]
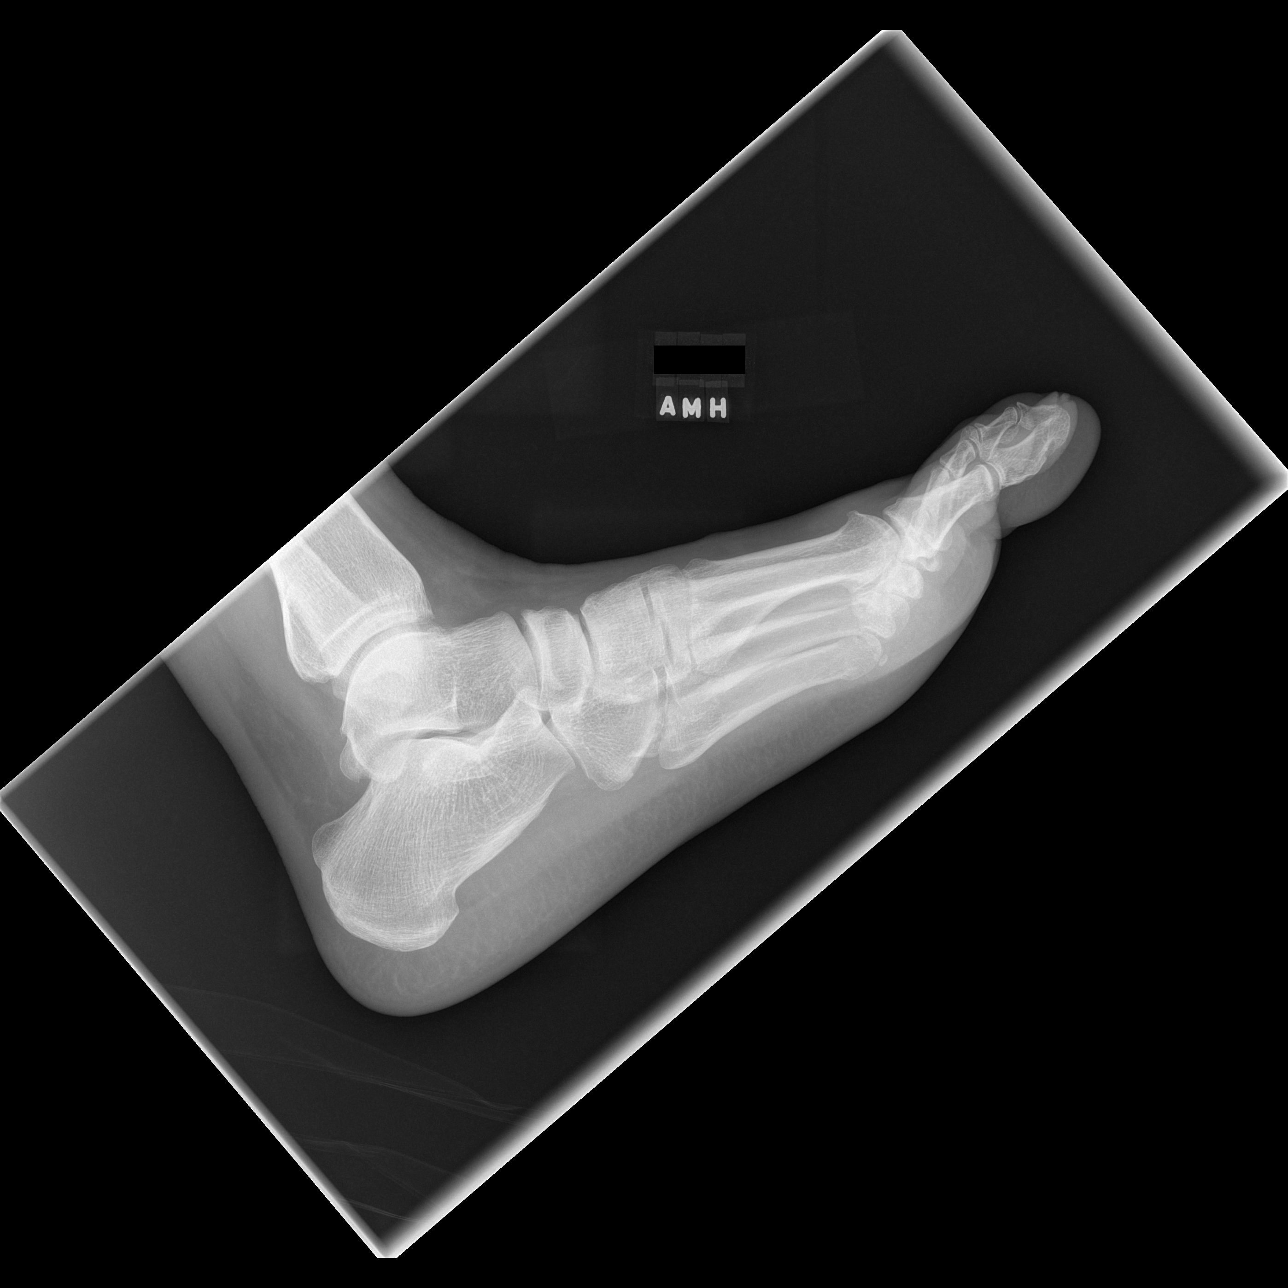

[3 of 3 positions shown; findings below may reference images not displayed]

FINDINGS: Three views of the left foot were obtained.  No evidence
for acute fracture or dislocation.  There is no gross soft tissue
abnormality.  Alignment of the left foot is within normal limits.
IMPRESSION: No acute bony abnormality in the left foot.

## 2018-08-20 DIAGNOSIS — S72142A Displaced intertrochanteric fracture of left femur, initial encounter for closed fracture: Secondary | ICD-10-CM

## 2018-08-20 DIAGNOSIS — E785 Hyperlipidemia, unspecified: Secondary | ICD-10-CM

## 2018-08-20 DIAGNOSIS — N400 Enlarged prostate without lower urinary tract symptoms: Secondary | ICD-10-CM

## 2018-08-20 DIAGNOSIS — F0390 Unspecified dementia without behavioral disturbance: Secondary | ICD-10-CM

## 2018-08-20 DIAGNOSIS — I10 Essential (primary) hypertension: Secondary | ICD-10-CM

## 2021-05-27 ENCOUNTER — Telehealth: Payer: Self-pay

## 2021-05-27 NOTE — Telephone Encounter (Signed)
Followup call to assist patient with establishing  care with [facility] as requested.  Pt completed enrollment with [facility] program on 9.9.22  Pt stated he has not yet received a f/u call for his medical appt but will plan to f/u with them on today to receive the appt information  Plan  Pt states he will f/u with Nurse W.Ravenne Wayment to provide the first appt information
